# Patient Record
Sex: Male | Born: 1980 | Race: Asian | Hispanic: No | Marital: Married | Smoking: Never smoker
Health system: Southern US, Community
[De-identification: ages and names within clinical notes are randomized; demographics above are authoritative.]

## PROBLEM LIST (undated history)

## (undated) DIAGNOSIS — B1910 Unspecified viral hepatitis B without hepatic coma: Secondary | ICD-10-CM

---

## 2006-05-11 ENCOUNTER — Emergency Department: Payer: Self-pay | Admitting: Emergency Medicine

## 2010-08-12 ENCOUNTER — Emergency Department: Payer: Self-pay | Admitting: Internal Medicine

## 2010-08-24 ENCOUNTER — Ambulatory Visit: Payer: Self-pay | Admitting: Otolaryngology

## 2012-02-14 IMAGING — CR NASAL BONES - 3+ VIEW
1 series · 3 of 3 positions shown · non-contrast
Comparison: none

REASON FOR EXAM: pain
COMMENTS:

PROCEDURE:     DXR - DXR NASAL BONES  - August 12, 2010 [DATE]
RESULT:     There is a comminuted fracture of the nasal bridge with anterior
fragments displaced posteriorly. The paranasal sinuses are clear.

[Series 1: view not recorded · 0.17mm/px · 3 of 3 slices shown]
[im 1/3]
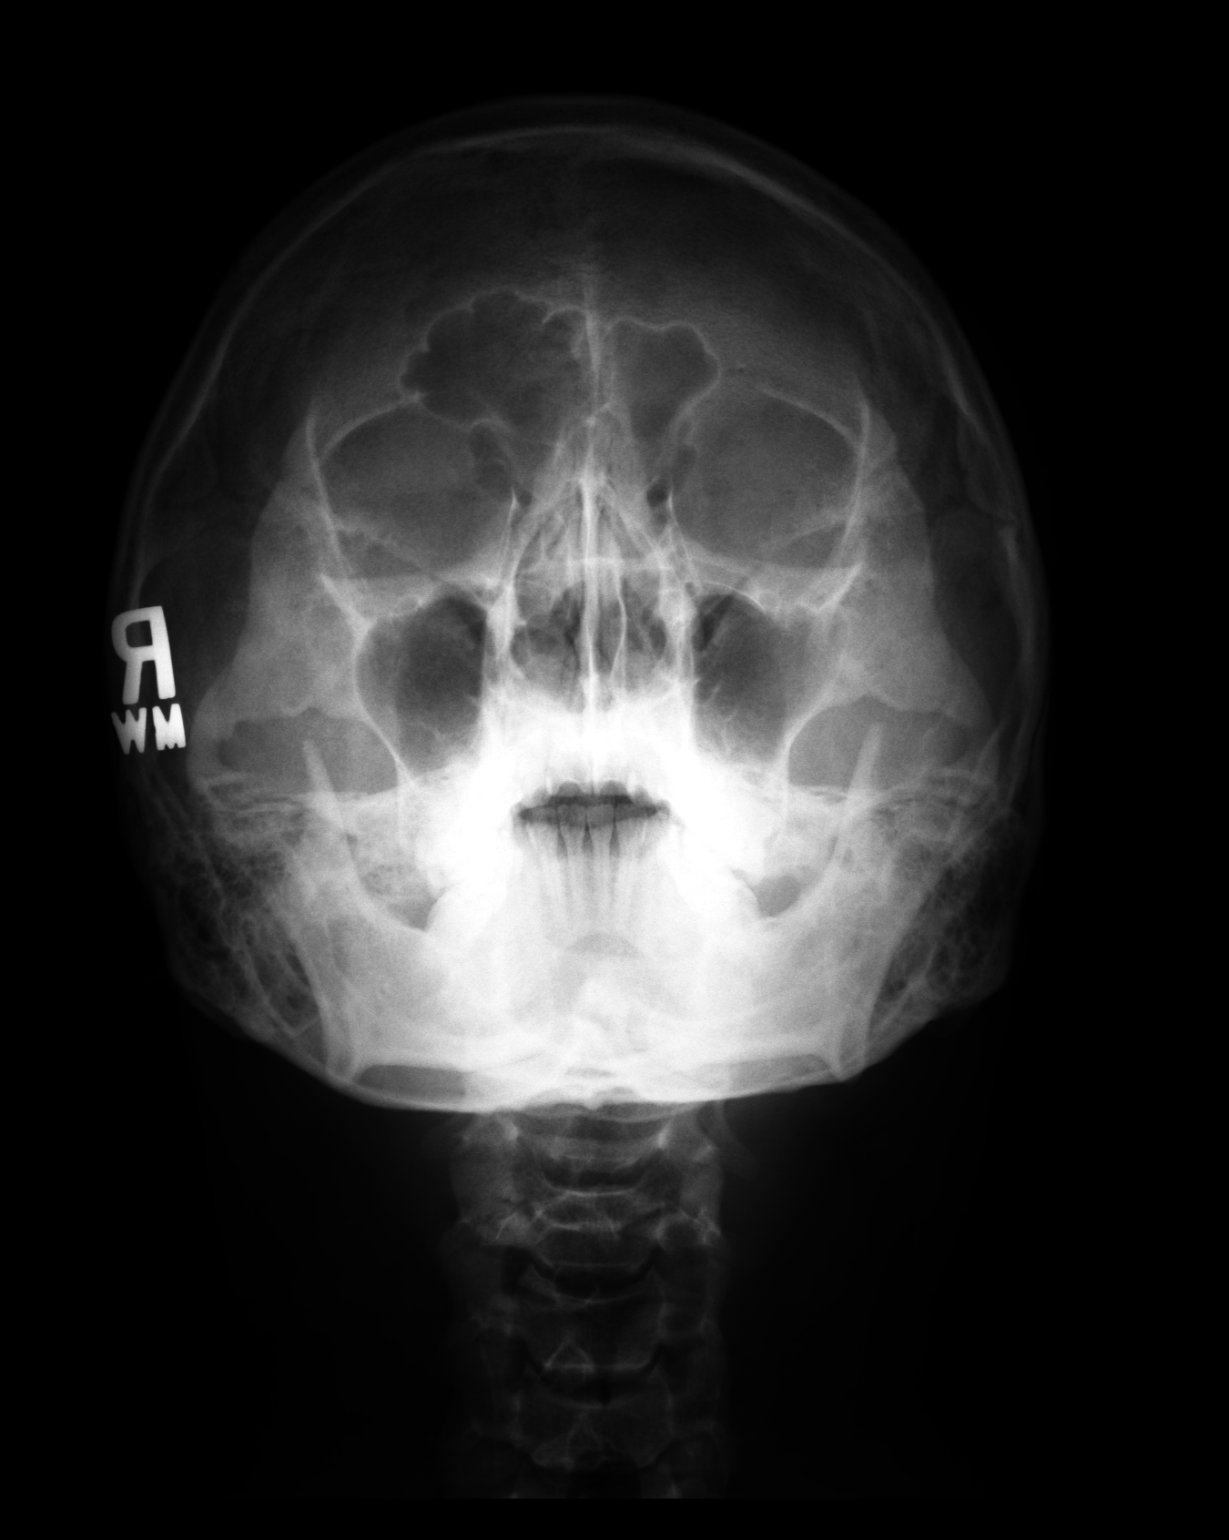
[im 2/3]
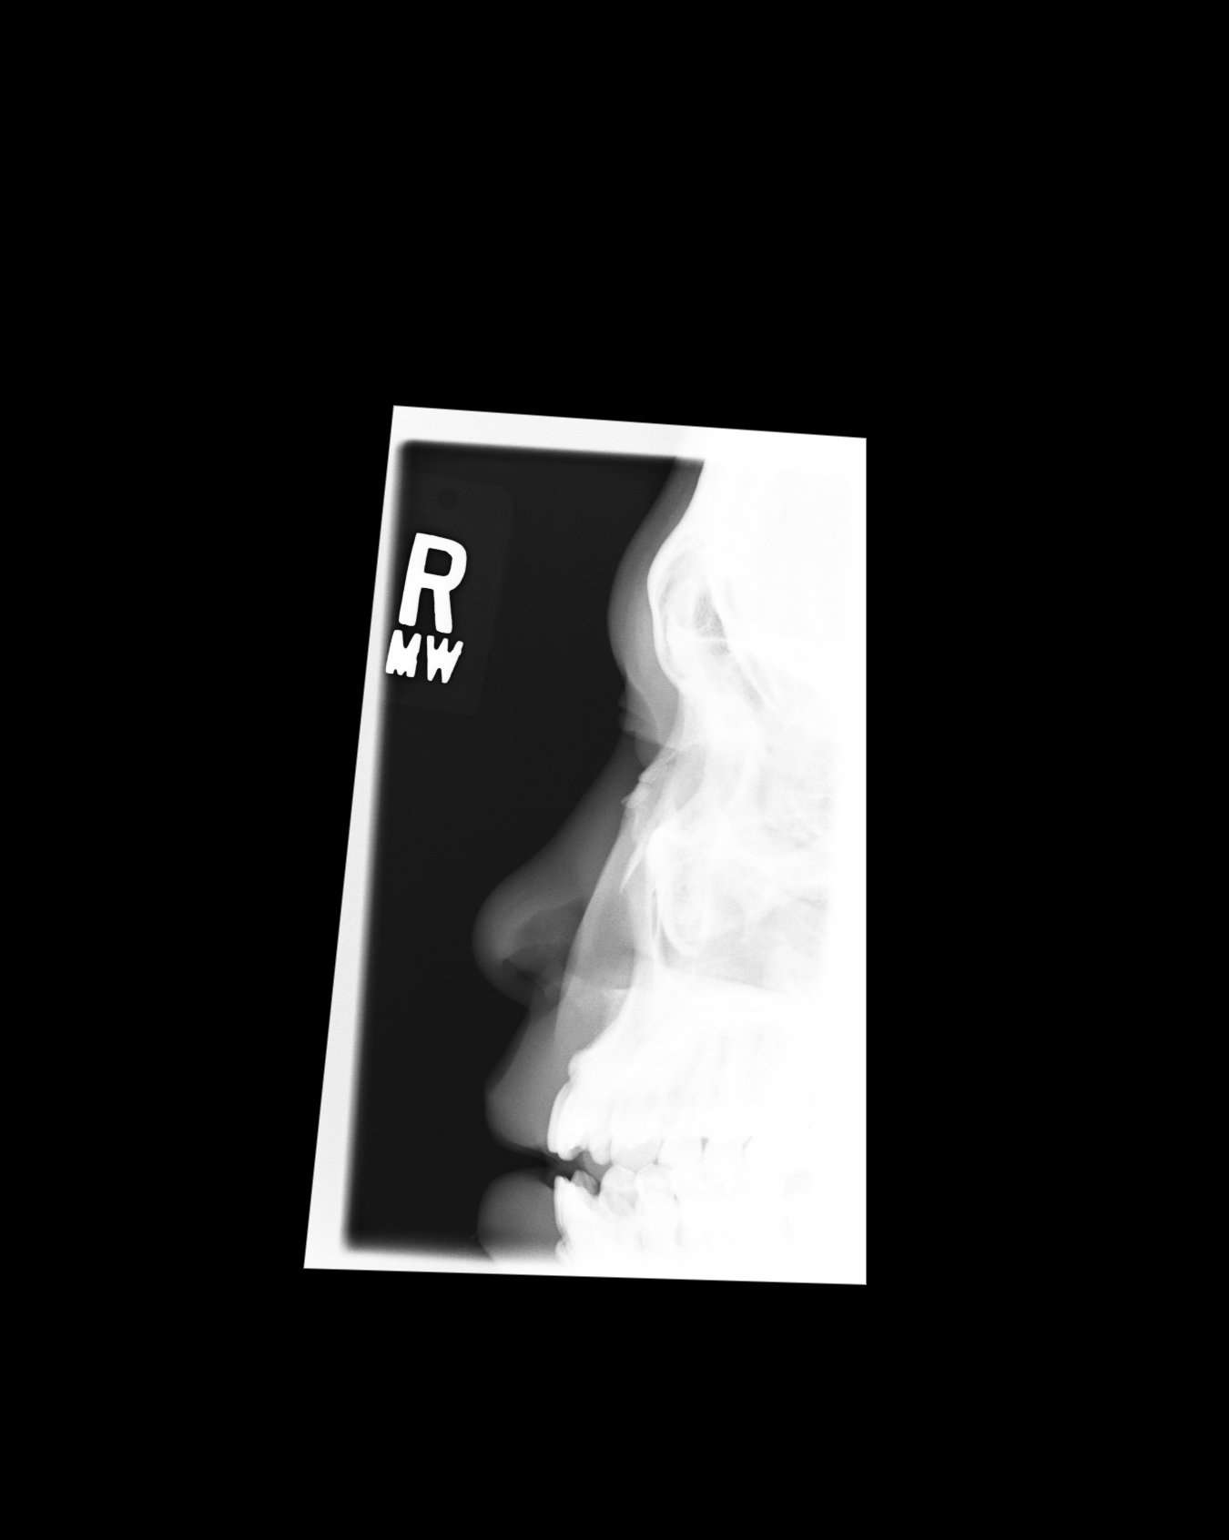
[im 3/3]
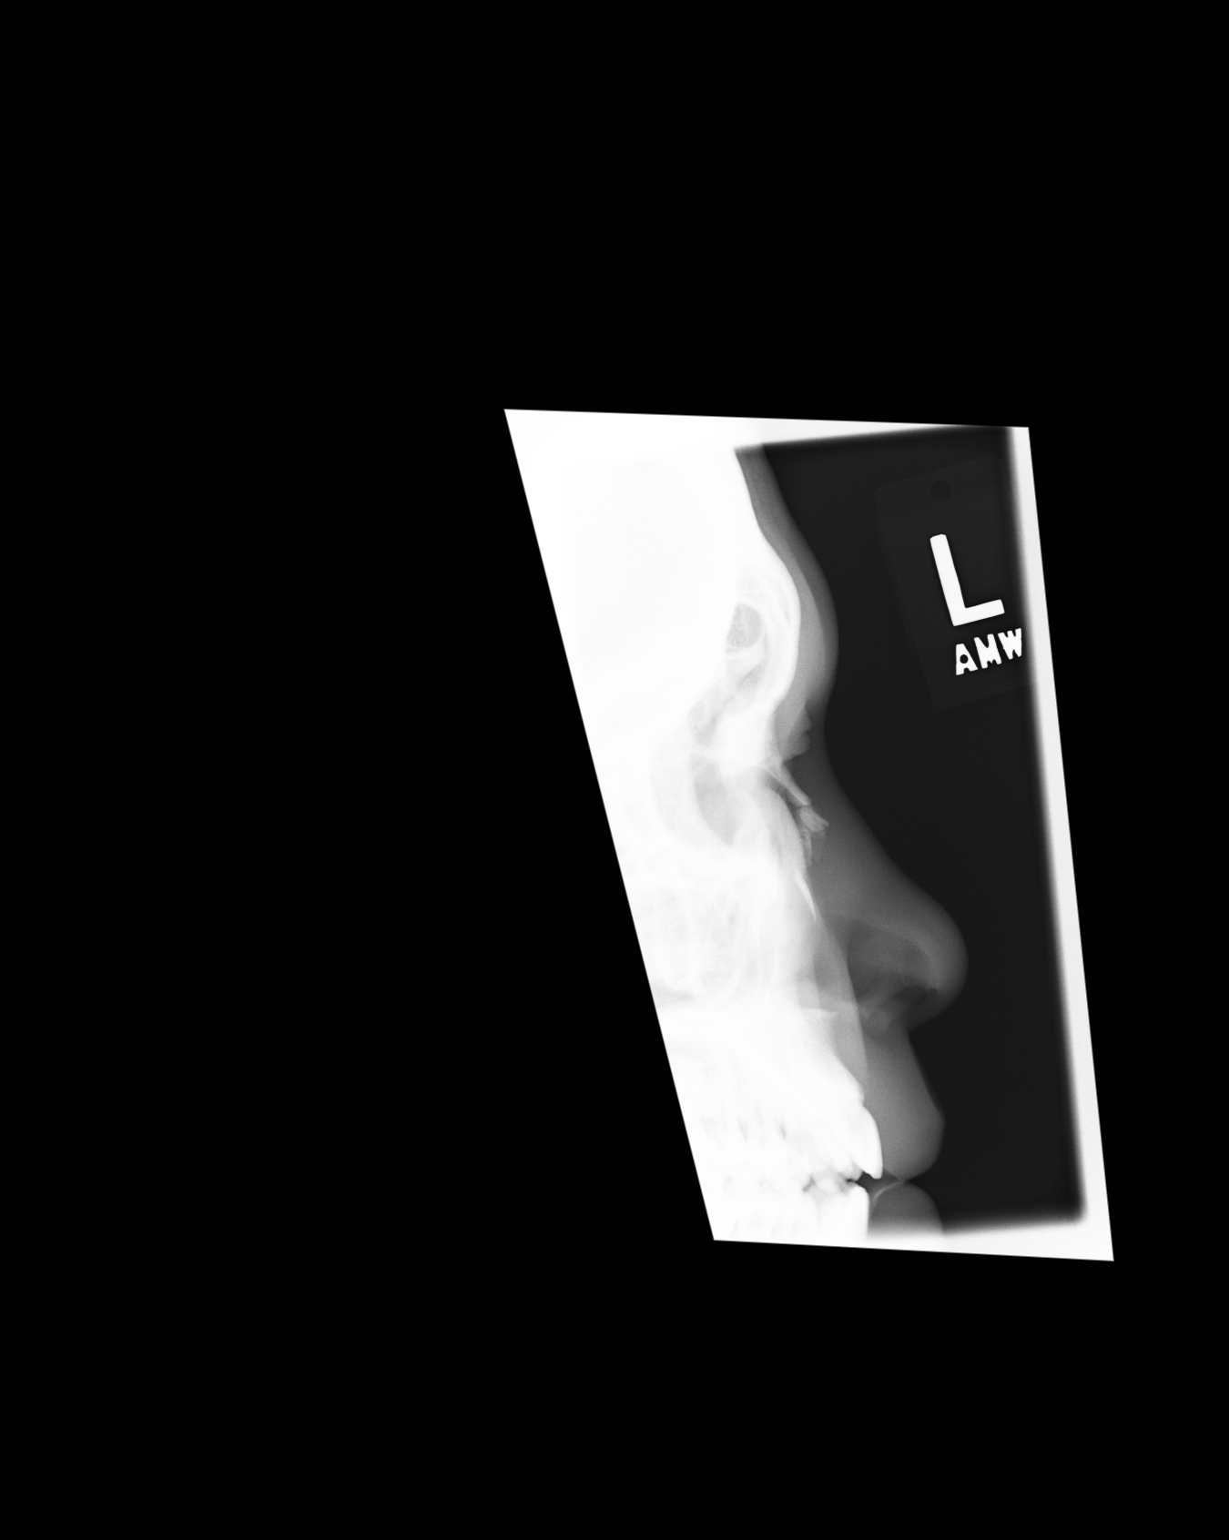

[3 of 3 positions shown; findings below may reference images not displayed]

IMPRESSION: 1.     Comminuted, displaced fracture of the nasal bridge.

## 2012-10-30 ENCOUNTER — Ambulatory Visit: Payer: Self-pay

## 2018-04-20 ENCOUNTER — Encounter (HOSPITAL_COMMUNITY): Payer: Self-pay | Admitting: Emergency Medicine

## 2018-04-20 ENCOUNTER — Emergency Department (HOSPITAL_COMMUNITY)
Admission: EM | Admit: 2018-04-20 | Discharge: 2018-04-20 | Disposition: A | Discharge status: Home / Self Care | Payer: BLUE CROSS/BLUE SHIELD | Attending: Emergency Medicine | Admitting: Emergency Medicine

## 2018-04-20 ENCOUNTER — Other Ambulatory Visit: Payer: Self-pay

## 2018-04-20 ENCOUNTER — Emergency Department (HOSPITAL_COMMUNITY): Payer: BLUE CROSS/BLUE SHIELD

## 2018-04-20 DIAGNOSIS — R072 Precordial pain: Secondary | ICD-10-CM | POA: Diagnosis present

## 2018-04-20 HISTORY — DX: Unspecified viral hepatitis B without hepatic coma: B19.10

## 2018-04-20 LAB — CBC
HEMATOCRIT: 44.7 % (ref 39.0–52.0)
HEMOGLOBIN: 14.4 g/dL (ref 13.0–17.0)
MCH: 29.3 pg (ref 26.0–34.0)
MCHC: 32.2 g/dL (ref 30.0–36.0)
MCV: 90.9 fL (ref 80.0–100.0)
Platelets: 246 10*3/uL (ref 150–400)
RBC: 4.92 MIL/uL (ref 4.22–5.81)
RDW: 12.3 % (ref 11.5–15.5)
WBC: 7.5 10*3/uL (ref 4.0–10.5)
nRBC: 0 % (ref 0.0–0.2)

## 2018-04-20 LAB — BASIC METABOLIC PANEL
Anion gap: 7 (ref 5–15)
BUN: 23 mg/dL — AB (ref 6–20)
CHLORIDE: 104 mmol/L (ref 98–111)
CO2: 25 mmol/L (ref 22–32)
CREATININE: 1.06 mg/dL (ref 0.61–1.24)
Calcium: 8.7 mg/dL — ABNORMAL LOW (ref 8.9–10.3)
GFR calc Af Amer: >60 mL/min (ref >60)
GLUCOSE: 95 mg/dL (ref 70–99)
POTASSIUM: 3.6 mmol/L (ref 3.5–5.1)
SODIUM: 136 mmol/L (ref 135–145)

## 2018-04-20 LAB — I-STAT TROPONIN, ED
Troponin i, poc: 0 ng/mL (ref 0.00–0.08)
Troponin i, poc: 0 ng/mL (ref 0.00–0.08)

## 2018-04-20 NOTE — Discharge Instructions (Addendum)

## 2018-04-20 NOTE — ED Triage Notes (Signed)
Pt BIB EMS from work. 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 Reports radiation to L arm, neck and jaw. Also reports nausea, mild SOB. Pt given 324mg  ASA, 1 nitro by EMS. Pain decr'd from 10/10 to 6/10 upon arrival in ED. No prior cardiac hx.

## 2018-04-20 NOTE — ED Notes (Signed)
Reviewed d/c instructions with pt, who verbalized understanding and had no outstanding questions. Pt departed in NAD, refused use of wheelchair.   

## 2018-04-20 NOTE — ED Notes (Signed)
ED Provider at bedside. 

## 2018-04-20 NOTE — ED Provider Notes (Signed)
[facility] EMERGENCY DEPARTMENT Provider Note   CSN: [id number] Arrival date & time: 04/20/18  0018     History   Chief Complaint Chief Complaint  Patient presents with  . Chest Pain    HPI Glenn Nunez is a 37 y.o. male.  The history is provided by the patient.  Chest Pain   This is a new problem. The current episode started 1 to 2 hours ago. The problem occurs constantly. The problem has been resolved. The pain is present in the substernal region. The pain is moderate. The quality of the pain is described as pressure-like. The pain radiates to the left shoulder. Duration of episode(s) is 45 minutes. Associated symptoms include shortness of breath. Pertinent negatives include no abdominal pain, no fever, no syncope and no vomiting. He has tried nitroglycerin (ASA) for the symptoms. The treatment provided no relief. There are no known risk factors.  Pertinent negatives for past medical history include no CAD and no PE.  Pertinent negatives for family medical history include: no CAD.  Patient presents for chest pain.  He reports he was driving and started having chest pressure that went into his left shoulder.  He also reports some mild neck pain.  He reports shortness of breath.  He is never had this.  Denies pleuritic pain.  Denies CAD/PE/DVT. Pt is a non-smoker.  He was given ASA/nitro by EMS, but it it did not improve his pain.  The pain resolved spontaneously  Past Medical History:  Diagnosis Date  . Hepatitis B     There are no active problems to display for this patient.   History reviewed. No pertinent surgical history.      Home Medications    Prior to Admission medications   Not on File    Family History History reviewed. No pertinent family history.  Social History Social History   Tobacco Use  . Smoking status: Never Smoker  . Smokeless tobacco: Never Used  Substance Use Topics  . Alcohol use: Never    Frequency: Never  . Drug  use: Never     Allergies   Prednisone   Review of Systems Review of Systems  Constitutional: Negative for fever.  Respiratory: Positive for shortness of breath.   Cardiovascular: Positive for chest pain. Negative for leg swelling and syncope.  Gastrointestinal: Negative for abdominal pain and vomiting.  All other systems reviewed and are negative.    Physical Exam Updated Vital Signs BP 108/87   Pulse (!) 57   Temp 98.1 F (36.7 C) (Oral)   Resp 15   Ht 1.626 m (5\' 4" )   Wt 77.1 kg   SpO2 99%   BMI 29.18 kg/m   Physical Exam CONSTITUTIONAL: Well developed/well nourished HEAD: Normocephalic/atraumatic EYES: EOMI/PERRL ENMT: Mucous membranes moist NECK: supple no meningeal signs SPINE/BACK:entire spine nontender CV: S1/S2 noted, no murmurs/rubs/gallops noted LUNGS: Lungs are clear to auscultation bilaterally, no apparent distress ABDOMEN: soft, nontender, no rebound or guarding, bowel sounds noted throughout abdomen GU:no cva tenderness NEURO: Pt is awake/alert/appropriate, moves all extremitiesx4.  No facial droop.   EXTREMITIES: pulses normal/equal, full ROM, no calf tenderness or edema SKIN: warm, color normal PSYCH: no abnormalities of mood noted, alert and oriented to situation   ED Treatments / Results  Labs (all labs ordered are listed, but only abnormal results are displayed) Labs Reviewed  BASIC METABOLIC PANEL - Abnormal; Notable for the following components:      Result Value   BUN 23 (*)  Calcium 8.7 (*)    All other components within normal limits  CBC  I-STAT TROPONIN, ED  I-STAT TROPONIN, ED    EKG EKG Interpretation  Date/Time:  Friday April 20 2018 00:30:39 EDT Ventricular Rate:  60 PR Interval:    QRS Duration: 96 QT Interval:  410 QTC Calculation: 410 R Axis:   72 Text Interpretation:  Sinus rhythm Confirmed by Zadie Rhine ([id number]) on 04/20/2018 12:44:28 AM   Radiology Dg Chest 2 View  Result Date:  04/20/2018 CLINICAL DATA:  Sudden onset central chest pain tonight. Radiates to the left arm, neck, and jaw. Nausea and shortness of breath. EXAM: CHEST - 2 VIEW COMPARISON:  None. FINDINGS: The heart size and mediastinal contours are within normal limits. Both lungs are clear. The visualized skeletal structures are unremarkable. IMPRESSION: No active cardiopulmonary disease. Electronically Signed   By: Burman Nieves M.D.   On: 04/20/2018 01:34    Procedures Procedures  Medications Ordered in ED Medications - No data to display   Initial Impression / Assessment and Plan / ED Course  I have reviewed the triage vital signs and the nursing notes.  Pertinent labs & imaging results that were available during my care of the patient were reviewed by me and considered in my medical decision making (see chart for details).     2:33 AM Heart score less than 3. Low suspicion for PE/ACS/dissection Will Get repeat troponin. I reviewed the prehospital EKG and our ER EKG, no acute changes 4:08 AM He remains chest pain-free.  Repeat troponin negative.  He appears PERC negative.  He will be discharged home.  We discussed her return precautions Final Clinical Impressions(s) / ED Diagnoses   Final diagnoses:  Precordial pain    ED Discharge Orders    None       Zadie Rhine, MD 04/20/18 [phone]

## 2018-04-20 NOTE — ED Notes (Signed)
Patient transported to X-ray 

## 2018-05-15 ENCOUNTER — Other Ambulatory Visit: Payer: Self-pay | Admitting: Cardiology

## 2018-05-15 DIAGNOSIS — R0789 Other chest pain: Secondary | ICD-10-CM

## 2018-05-15 DIAGNOSIS — R270 Ataxia, unspecified: Secondary | ICD-10-CM

## 2018-06-01 ENCOUNTER — Ambulatory Visit: Payer: BLUE CROSS/BLUE SHIELD

## 2018-06-06 ENCOUNTER — Ambulatory Visit: Payer: BLUE CROSS/BLUE SHIELD

## 2019-04-06 ENCOUNTER — Other Ambulatory Visit: Payer: Self-pay

## 2019-04-06 ENCOUNTER — Emergency Department: Payer: BC Managed Care – PPO

## 2019-04-06 ENCOUNTER — Emergency Department
Admission: EM | Admit: 2019-04-06 | Discharge: 2019-04-06 | Disposition: A | Discharge status: Home / Self Care | Payer: BC Managed Care – PPO | Attending: Emergency Medicine | Admitting: Emergency Medicine

## 2019-04-06 DIAGNOSIS — R0789 Other chest pain: Secondary | ICD-10-CM | POA: Insufficient documentation

## 2019-04-06 DIAGNOSIS — R0602 Shortness of breath: Secondary | ICD-10-CM | POA: Diagnosis not present

## 2019-04-06 DIAGNOSIS — F419 Anxiety disorder, unspecified: Secondary | ICD-10-CM

## 2019-04-06 DIAGNOSIS — Z20828 Contact with and (suspected) exposure to other viral communicable diseases: Secondary | ICD-10-CM | POA: Insufficient documentation

## 2019-04-06 DIAGNOSIS — R0600 Dyspnea, unspecified: Secondary | ICD-10-CM

## 2019-04-06 LAB — BASIC METABOLIC PANEL
Anion gap: 10 (ref 5–15)
BUN: 16 mg/dL (ref 6–20)
CO2: 25 mmol/L (ref 22–32)
Calcium: 9.7 mg/dL (ref 8.9–10.3)
Chloride: 102 mmol/L (ref 98–111)
Creatinine, Ser: 1.12 mg/dL (ref 0.61–1.24)
GFR calc Af Amer: >60 mL/min (ref >60)
GFR calc non Af Amer: >60 mL/min (ref >60)
Glucose, Bld: 95 mg/dL (ref 70–99)
Potassium: 3.6 mmol/L (ref 3.5–5.1)
Sodium: 137 mmol/L (ref 135–145)

## 2019-04-06 LAB — CBC
HCT: 44.6 % (ref 39.0–52.0)
Hemoglobin: 15 g/dL (ref 13.0–17.0)
MCH: 29.2 pg (ref 26.0–34.0)
MCHC: 33.6 g/dL (ref 30.0–36.0)
MCV: 86.9 fL (ref 80.0–100.0)
Platelets: 264 10*3/uL (ref 150–400)
RBC: 5.13 MIL/uL (ref 4.22–5.81)
RDW: 12.4 % (ref 11.5–15.5)
WBC: 10.2 10*3/uL (ref 4.0–10.5)
nRBC: 0 % (ref 0.0–0.2)

## 2019-04-06 LAB — SARS CORONAVIRUS 2 BY RT PCR (HOSPITAL ORDER, PERFORMED IN ~~LOC~~ HOSPITAL LAB): SARS Coronavirus 2: NEGATIVE

## 2019-04-06 LAB — TROPONIN I (HIGH SENSITIVITY): Troponin I (High Sensitivity): <2 ng/L (ref <18)

## 2019-04-06 MED ORDER — DIAZEPAM 5 MG PO TABS
5.0000 mg | ORAL_TABLET | Freq: Once | ORAL | Status: AC
  Administered 2019-04-06: 5 mg via ORAL
  Filled 2019-04-06: qty 1

## 2019-04-06 MED ORDER — DIAZEPAM 5 MG PO TABS: 5.0000 mg | ORAL_TABLET | Freq: Three times a day (TID) | ORAL | 0 refills | Status: AC | PRN

## 2019-04-06 MED ORDER — ONDANSETRON 4 MG PO TBDP: 4.0000 mg | ORAL_TABLET | Freq: Three times a day (TID) | ORAL | 0 refills | Status: AC | PRN

## 2019-04-06 MED ORDER — ONDANSETRON 4 MG PO TBDP
4.0000 mg | ORAL_TABLET | Freq: Once | ORAL | Status: AC
  Administered 2019-04-06: 4 mg via ORAL
  Filled 2019-04-06: qty 1

## 2019-04-06 NOTE — ED Notes (Signed)
First nurse note: pt arrives in no acute distress, walking in without difficulty. Pt states "I have chest pain and I can't breathe". Pt refusing to keep mask on, states he may have COVID. Pt states mask makes it difficult to breathe. Pt instructed to please leave mask in place and assisted to wheelchair. Pt taken to triage room 1, pox on ra 100%. Butch, triage RN in to triage pt.

## 2019-04-06 NOTE — ED Provider Notes (Signed)
[facility] Emergency Department Provider Note       Time seen: ----------------------------------------- 8:43 PM on 04/06/2019 -----------------------------------------   I have reviewed the triage vital signs and the nursing notes.  HISTORY   Chief Complaint Shortness of Breath and Chest Pain    HPI Glenn Nunez is a 38 y.o. male with a history of hepatitis B who presents to the ED for shortness of breath and chest pain since 8 PM last night.  She also reports vomiting and diarrhea and feeling like she could not catch her breath since that time.  She has not had any fever or positive COVID contacts.  Chest pain is substernal with radiation into his neck.  Past Medical History:  Diagnosis Date  . Hepatitis B     There are no active problems to display for this patient.   History reviewed. No pertinent surgical history.  Allergies Prednisone  Social History Social History   Tobacco Use  . Smoking status: Never Smoker  . Smokeless tobacco: Never Used  Substance Use Topics  . Alcohol use: Never    Frequency: Never  . Drug use: Never   Review of Systems Constitutional: Negative for fever. Cardiovascular: Positive for chest pain Respiratory: Positive for shortness of breath Gastrointestinal: Negative for abdominal pain, positive for vomiting and diarrhea Musculoskeletal: Negative for back pain. Skin: Negative for rash. Neurological: Negative for headaches, focal weakness or numbness.  All systems negative/normal/unremarkable except as stated in the HPI  ____________________________________________   PHYSICAL EXAM:  VITAL SIGNS: ED Triage Vitals  Enc Vitals Group     BP 04/06/19 2014 130/73     Pulse Rate 04/06/19 2014 79     Resp 04/06/19 2014 18     Temp 04/06/19 2014 97.8 F (36.6 C)     Temp Source 04/06/19 2014 Oral     SpO2 04/06/19 2014 100 %     Weight 04/06/19 2015 170 lb (77.1 kg)     Height 04/06/19 2015 5\' 4"   (1.626 m)     Head Circumference --      Peak Flow --      Pain Score 04/06/19 2015 7     Pain Loc --      Pain Edu? --      Excl. in GC? --    Constitutional: Alert and oriented. Well appearing and in no distress. Eyes: Conjunctivae are normal. Normal extraocular movements. Cardiovascular: Normal rate, regular rhythm. No murmurs, rubs, or gallops. Respiratory: Normal respiratory effort without tachypnea nor retractions. Breath sounds are clear and equal bilaterally. No wheezes/rales/rhonchi. Gastrointestinal: Soft and nontender. Normal bowel sounds Musculoskeletal: Nontender with normal range of motion in extremities. No lower extremity tenderness nor edema. Neurologic:  Normal speech and language. No gross focal neurologic deficits are appreciated.  Skin:  Skin is warm, dry and intact. No rash noted. Psychiatric: Mood and affect are normal. Speech and behavior are normal.  ____________________________________________  EKG: Interpreted by me.  Sinus rhythm with rate of 69 bpm, normal PR interval, normal QRS, normal QT  ____________________________________________  ED COURSE:  As part of my medical decision making, I reviewed the following data within the electronic MEDICAL RECORD NUMBER History obtained from family if available, nursing notes, old chart and ekg, as well as notes from prior ED visits. Patient presented for chest pain, shortness of breath, vomiting and diarrhea, we will assess with labs and imaging as indicated at this time.   Procedures  [name] was evaluated in Emergency  Department on 04/06/2019 for the symptoms described in the history of present illness. He was evaluated in the context of the global COVID-19 pandemic, which necessitated consideration that the patient might be at risk for infection with the SARS-CoV-2 virus that causes COVID-19. Institutional protocols and algorithms that pertain to the evaluation of patients at risk for COVID-19 are in a state of  rapid change based on information released by regulatory bodies including the CDC and federal and state organizations. These policies and algorithms were followed during the patient's care in the ED.  ____________________________________________   LABS (pertinent positives/negatives)  Labs Reviewed  CBC  BASIC METABOLIC PANEL  TROPONIN I (HIGH SENSITIVITY)    RADIOLOGY  Chest x-ray is unremarkable  ____________________________________________   DIFFERENTIAL DIAGNOSIS   URI, pneumonia, bronchitis, anxiety, COVID-19  FINAL ASSESSMENT AND PLAN  Chest pain, shortness of breath   Plan: The patient had presented for chest pain, shortness of breath and vomiting or diarrhea. Patient's labs are unremarkable. Patient's imaging did not reveal any acute process.  He was given oral Valium and is otherwise in good condition.  He is cleared for outpatient follow-up.   Laurence Aly, MD    Note: This note was generated in part or whole with voice recognition software. Voice recognition is usually quite accurate but there are transcription errors that can and very often do occur. I apologize for any typographical errors that were not detected and corrected.     Earleen Newport, MD 04/06/19 2050

## 2019-04-06 NOTE — ED Triage Notes (Signed)
Pt arrives to ED via POV from home with c/o [facility] and CP since 8pm last night. Pt also reports vomiting, diarrhea, and feeling like "I couldn't catch my breath" since that time, as well. Pt denies fever; no positive contacts with Covid that he knows of. Pt reports same s/x's last year around this time. Pt reports CP is substernal with radiation into his neck. Pt is A&Ox4, anxious appearing; RR even, regular, and unlabored.

## 2019-04-06 NOTE — ED Notes (Signed)
ED Provider at bedside to update  

## 2019-10-04 ENCOUNTER — Other Ambulatory Visit: Payer: Self-pay | Admitting: Physical Medicine & Rehabilitation

## 2019-10-04 DIAGNOSIS — M5414 Radiculopathy, thoracic region: Secondary | ICD-10-CM

## 2019-10-04 DIAGNOSIS — M5416 Radiculopathy, lumbar region: Secondary | ICD-10-CM

## 2019-10-16 ENCOUNTER — Ambulatory Visit: Payer: BC Managed Care – PPO

## 2019-10-23 IMAGING — CR DG CHEST 2V
2 series · 2 of 2 positions shown · non-contrast
Comparison: None.

CLINICAL DATA: Sudden onset central chest pain tonight. Radiates to
the left arm, neck, and jaw. Nausea and shortness of breath.

EXAM:
CHEST - 2 VIEW

[chest pa]
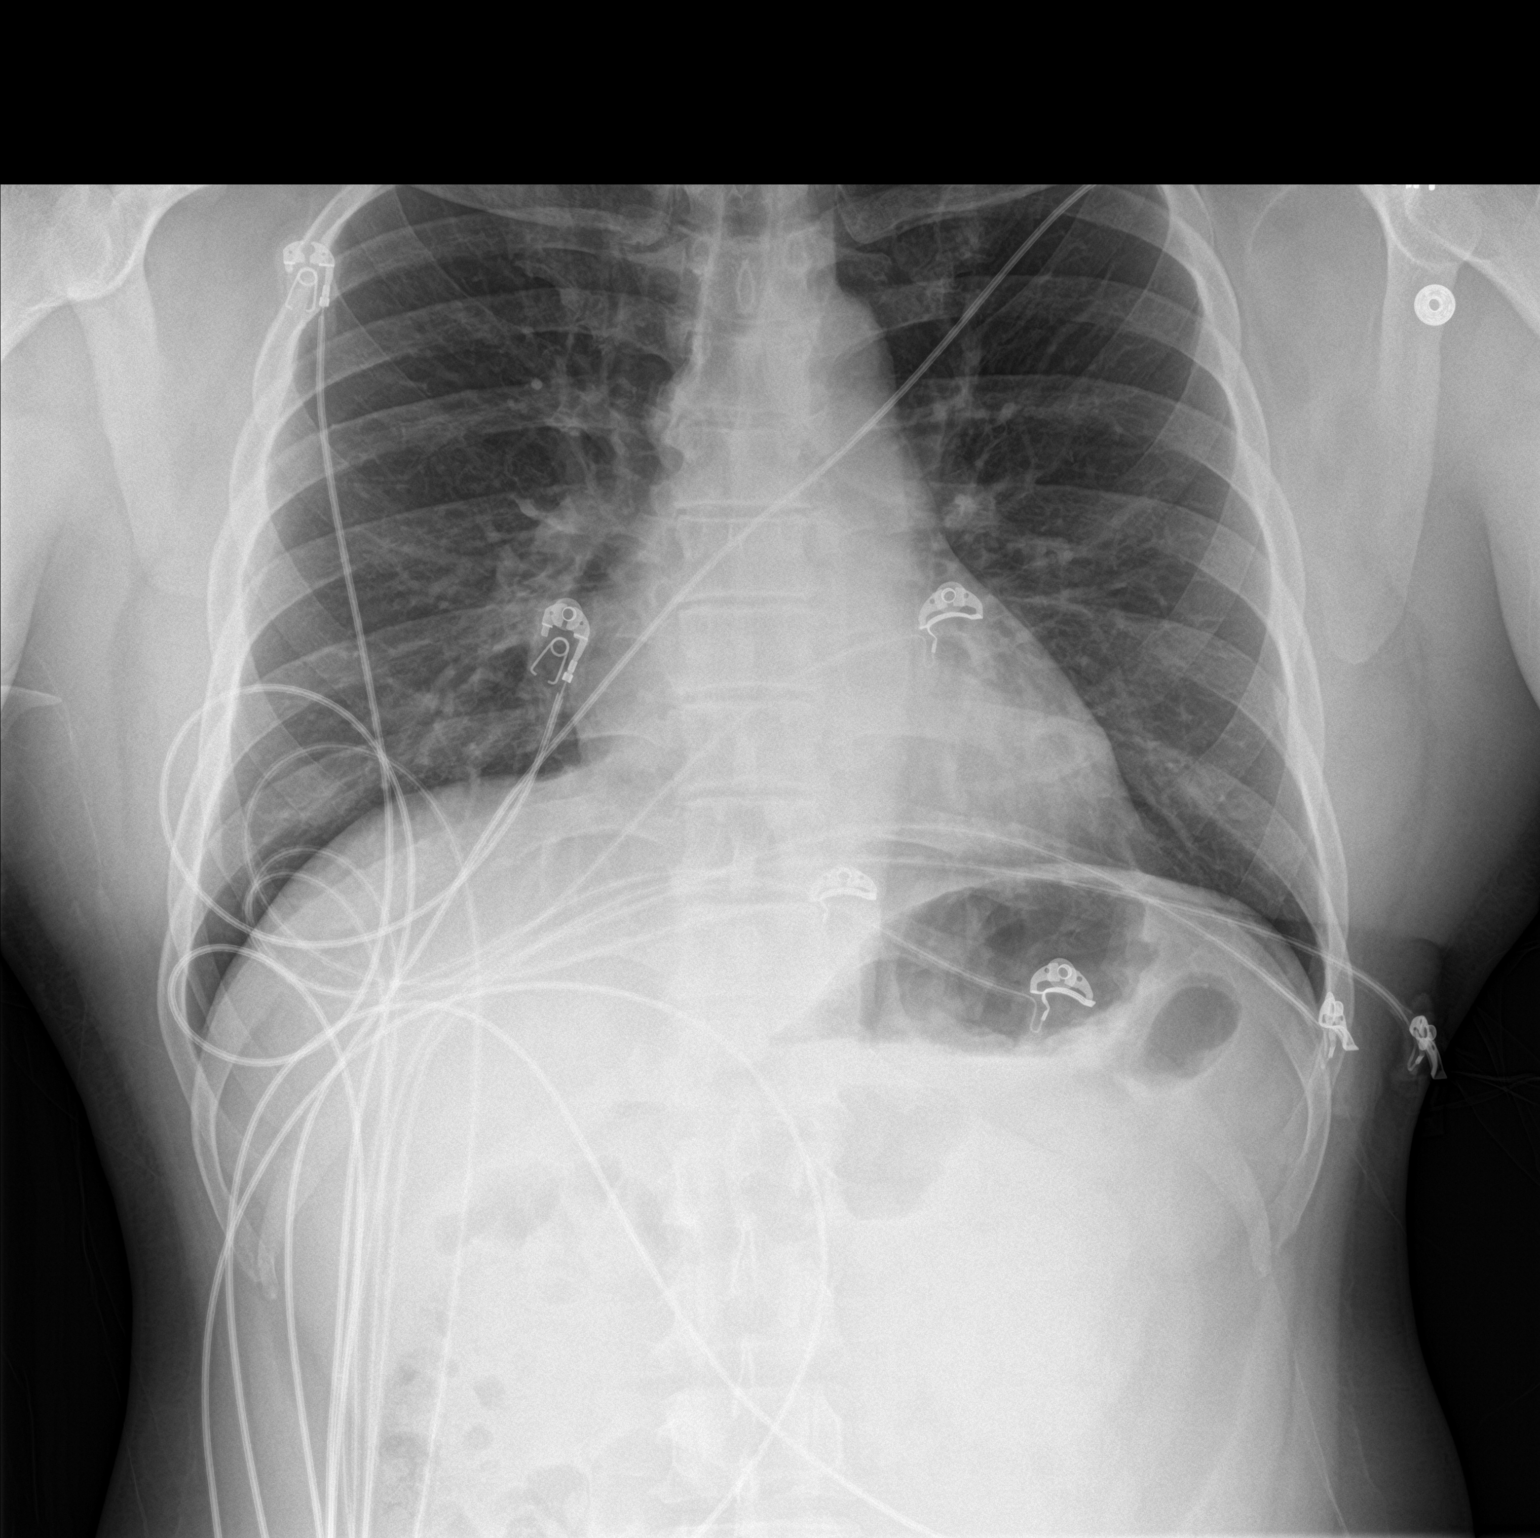

[chest lat]
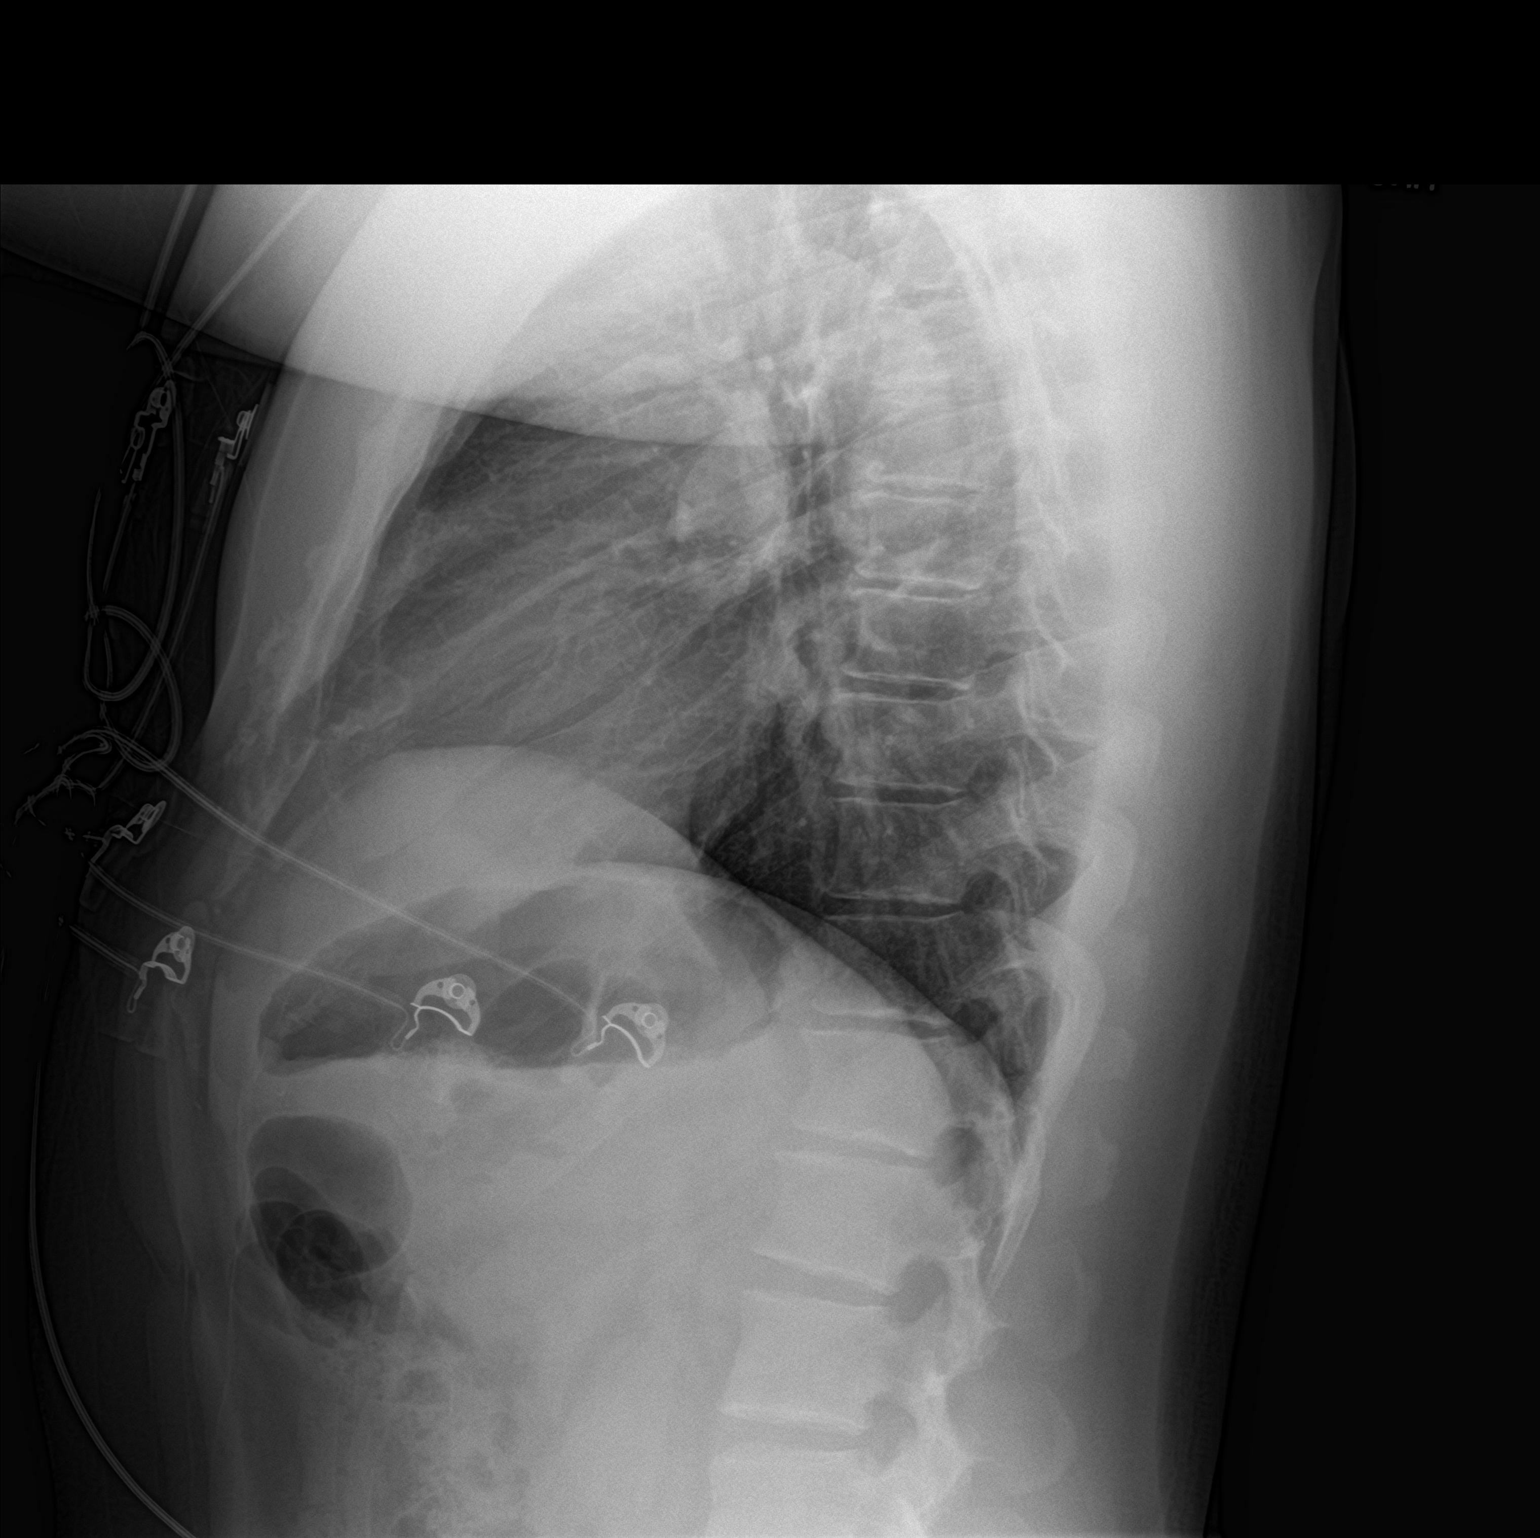

[2 of 2 positions shown; findings below may reference images not displayed]

FINDINGS: The heart size and mediastinal contours are within normal limits.
Both lungs are clear. The visualized skeletal structures are
unremarkable.
IMPRESSION: No active cardiopulmonary disease.

## 2019-10-28 ENCOUNTER — Ambulatory Visit: Payer: BC Managed Care – PPO

## 2019-10-28 ENCOUNTER — Ambulatory Visit: Admission: RE | Admit: 2019-10-28 | Payer: BC Managed Care – PPO | Source: Ambulatory Visit

## 2020-10-08 IMAGING — CR DG CHEST 2V
1 series · 2 of 2 positions shown · non-contrast
Comparison: April 20, 2018

CLINICAL DATA: Shortness of breath and chest pain

EXAM:
CHEST - 2 VIEW

[Series 1: dg chest 2 view · 0.14mm/px · 2 of 2 slices shown]
[im 1/2]
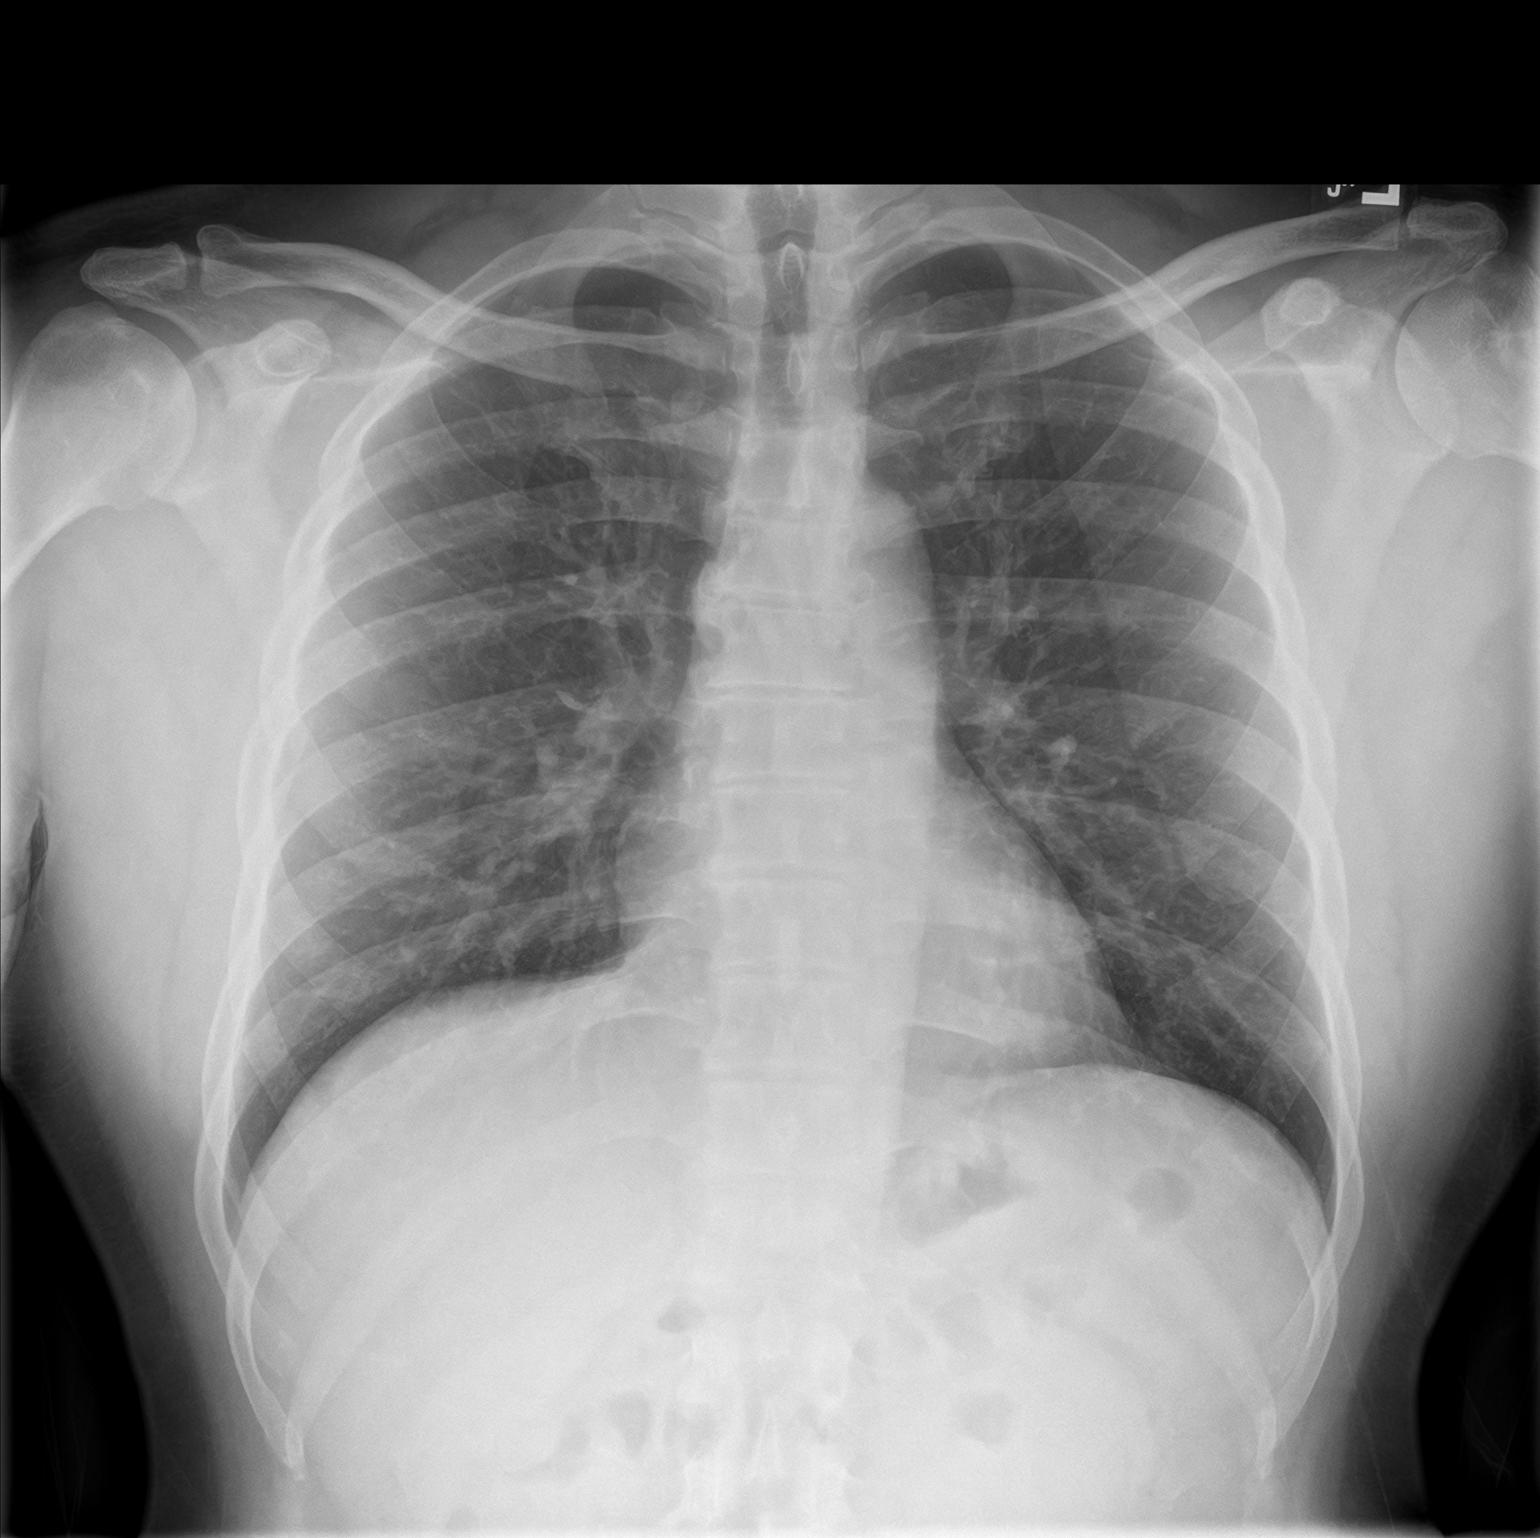
[im 2/2]
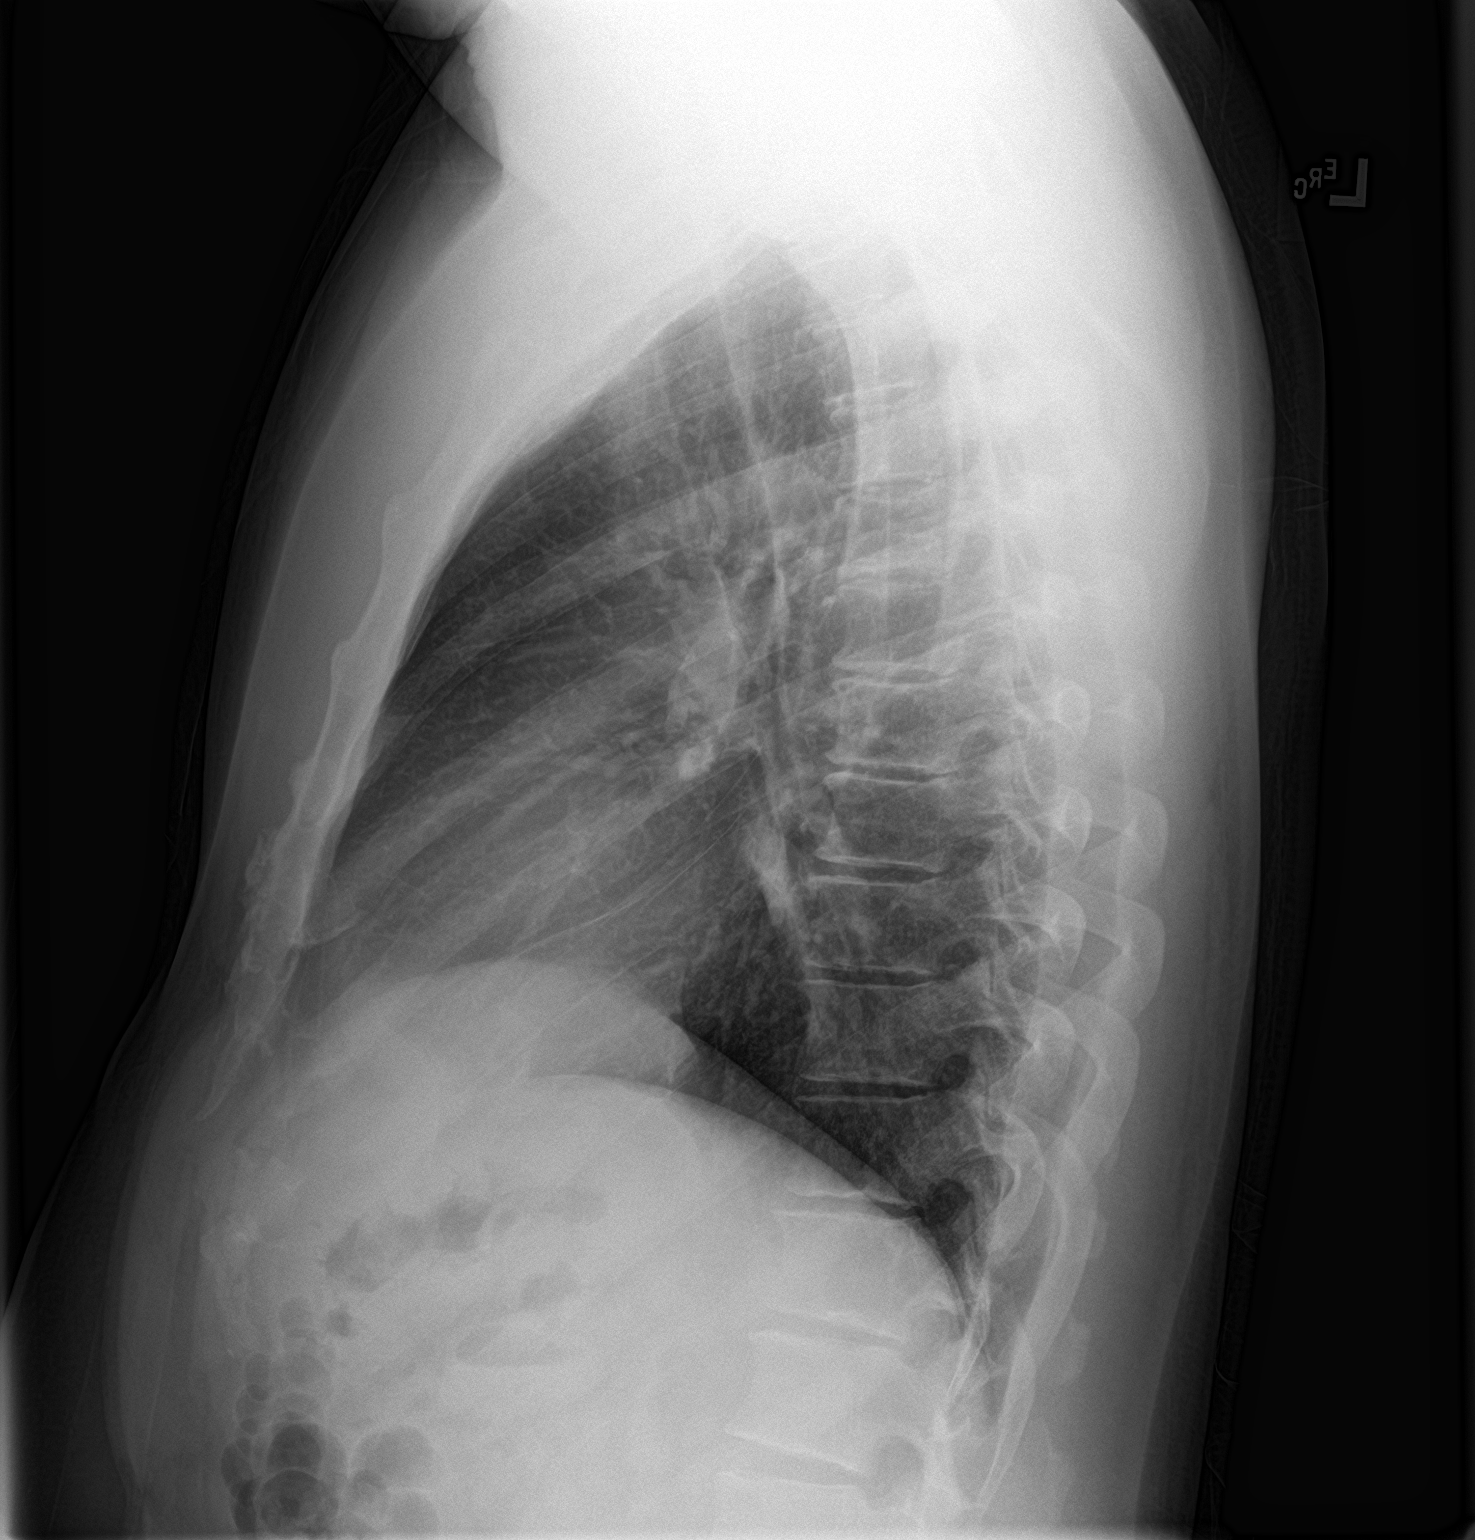

[2 of 2 positions shown; findings below may reference images not displayed]

FINDINGS: The heart size and mediastinal contours are within normal limits.
Both lungs are clear. The visualized skeletal structures are
unremarkable.
IMPRESSION: No active cardiopulmonary disease.
